# Patient Record
Sex: Female | Born: 1970 | Hispanic: Yes | Marital: Single | Smoking: Never smoker
Health system: Southern US, Community
[De-identification: ages and names within clinical notes are randomized; demographics above are authoritative.]

## PROBLEM LIST (undated history)

## (undated) DIAGNOSIS — N809 Endometriosis, unspecified: Secondary | ICD-10-CM

## (undated) DIAGNOSIS — J329 Chronic sinusitis, unspecified: Secondary | ICD-10-CM

## (undated) HISTORY — PX: ABDOMINAL HYSTERECTOMY: SHX81

## (undated) HISTORY — DX: Chronic sinusitis, unspecified: J32.9

## (undated) HISTORY — PX: TUBAL LIGATION: SHX77

## (undated) HISTORY — DX: Endometriosis, unspecified: N80.9

---

## 2002-06-28 ENCOUNTER — Encounter: Admission: RE | Admit: 2002-06-28 | Discharge: 2002-06-28 | Payer: Self-pay | Admitting: Family Medicine

## 2002-06-28 ENCOUNTER — Encounter: Payer: Self-pay | Admitting: Family Medicine

## 2002-12-22 ENCOUNTER — Encounter: Admission: RE | Admit: 2002-12-22 | Discharge: 2002-12-22 | Payer: Self-pay | Admitting: Family Medicine

## 2002-12-22 ENCOUNTER — Encounter: Payer: Self-pay | Admitting: Family Medicine

## 2003-05-11 ENCOUNTER — Other Ambulatory Visit: Admission: RE | Admit: 2003-05-11 | Discharge: 2003-05-11 | Payer: Self-pay | Admitting: Gynecology

## 2003-10-23 ENCOUNTER — Inpatient Hospital Stay (HOSPITAL_COMMUNITY): Admission: AD | Admit: 2003-10-23 | Discharge: 2003-10-26 | Payer: Self-pay | Admitting: *Deleted

## 2004-05-07 ENCOUNTER — Other Ambulatory Visit: Admission: RE | Admit: 2004-05-07 | Discharge: 2004-05-07 | Payer: Self-pay | Admitting: Gynecology

## 2004-11-13 ENCOUNTER — Ambulatory Visit (HOSPITAL_BASED_OUTPATIENT_CLINIC_OR_DEPARTMENT_OTHER): Admission: RE | Admit: 2004-11-13 | Discharge: 2004-11-13 | Payer: Self-pay | Admitting: Gynecology

## 2005-05-20 ENCOUNTER — Other Ambulatory Visit: Admission: RE | Admit: 2005-05-20 | Discharge: 2005-05-20 | Payer: Self-pay | Admitting: Gynecology

## 2006-03-10 ENCOUNTER — Ambulatory Visit (HOSPITAL_COMMUNITY): Admission: RE | Admit: 2006-03-10 | Discharge: 2006-03-10 | Payer: Self-pay | Admitting: Gynecology

## 2006-03-19 ENCOUNTER — Encounter: Admission: RE | Admit: 2006-03-19 | Discharge: 2006-03-19 | Payer: Self-pay | Admitting: Gastroenterology

## 2013-04-04 ENCOUNTER — Encounter: Payer: Self-pay | Admitting: Neurology

## 2013-04-05 ENCOUNTER — Ambulatory Visit: Payer: PRIVATE HEALTH INSURANCE | Admitting: Neurology

## 2013-06-07 ENCOUNTER — Telehealth: Payer: Self-pay | Admitting: Neurology

## 2013-06-07 NOTE — Telephone Encounter (Signed)
Calling patient to r/s missed appointment, informed by gentleman that answered the phone wrong number [phone]).

## 2014-09-30 ENCOUNTER — Encounter (HOSPITAL_BASED_OUTPATIENT_CLINIC_OR_DEPARTMENT_OTHER): Payer: Self-pay | Admitting: *Deleted

## 2014-09-30 ENCOUNTER — Emergency Department (HOSPITAL_BASED_OUTPATIENT_CLINIC_OR_DEPARTMENT_OTHER)
Admission: EM | Admit: 2014-09-30 | Discharge: 2014-09-30 | Disposition: A | Discharge status: Home / Self Care | Payer: PRIVATE HEALTH INSURANCE | Attending: Emergency Medicine | Admitting: Emergency Medicine

## 2014-09-30 DIAGNOSIS — Y9289 Other specified places as the place of occurrence of the external cause: Secondary | ICD-10-CM | POA: Diagnosis not present

## 2014-09-30 DIAGNOSIS — W540XXA Bitten by dog, initial encounter: Secondary | ICD-10-CM | POA: Insufficient documentation

## 2014-09-30 DIAGNOSIS — Y9389 Activity, other specified: Secondary | ICD-10-CM | POA: Insufficient documentation

## 2014-09-30 DIAGNOSIS — S6992XA Unspecified injury of left wrist, hand and finger(s), initial encounter: Secondary | ICD-10-CM | POA: Diagnosis present

## 2014-09-30 DIAGNOSIS — S60512A Abrasion of left hand, initial encounter: Secondary | ICD-10-CM | POA: Diagnosis not present

## 2014-09-30 DIAGNOSIS — Y998 Other external cause status: Secondary | ICD-10-CM | POA: Diagnosis not present

## 2014-09-30 DIAGNOSIS — Z8742 Personal history of other diseases of the female genital tract: Secondary | ICD-10-CM | POA: Insufficient documentation

## 2014-09-30 DIAGNOSIS — Z23 Encounter for immunization: Secondary | ICD-10-CM | POA: Diagnosis not present

## 2014-09-30 DIAGNOSIS — Z8709 Personal history of other diseases of the respiratory system: Secondary | ICD-10-CM | POA: Insufficient documentation

## 2014-09-30 DIAGNOSIS — S61452A Open bite of left hand, initial encounter: Secondary | ICD-10-CM

## 2014-09-30 MED ORDER — TETANUS-DIPHTH-ACELL PERTUSSIS 5-2.5-18.5 LF-MCG/0.5 IM SUSP
0.5000 mL | Freq: Once | INTRAMUSCULAR | Status: AC
  Administered 2014-09-30: 0.5 mL via INTRAMUSCULAR
  Filled 2014-09-30: qty 0.5

## 2014-09-30 MED ORDER — AMOXICILLIN-POT CLAVULANATE 875-125 MG PO TABS: 1.0000 | ORAL_TABLET | Freq: Two times a day (BID) | ORAL | Status: DC

## 2014-09-30 NOTE — ED Notes (Signed)
Bitten by a dog in her left hand and arm yesterday.

## 2014-09-30 NOTE — Discharge Instructions (Signed)

## 2014-09-30 NOTE — ED Provider Notes (Signed)
CSN: [id number]     Arrival date & time 09/30/14  1547 History   First MD Initiated Contact with Patient 09/30/14 1604     Chief Complaint  Patient presents with  . Animal Bite     (Consider location/radiation/quality/duration/timing/severity/associated sxs/prior Treatment) Patient is a 44 y.o. female presenting with animal bite. The history is provided by the patient. No language interpreter was used.  Animal Bite Contact animal:  Dog Location:  Hand Hand injury location:  L hand Time since incident:  1 day Pain details:    Quality:  Aching   Severity:  Moderate   Timing:  Constant   Progression:  Worsening Provoked: unprovoked   Notifications:  None Animal's rabies vaccination status:  Up to date Animal in possession: yes   Tetanus status:  Out of date Relieved by:  Nothing Worsened by:  Nothing tried Ineffective treatments:  None tried   Past Medical History  Diagnosis Date  . Chronic sinusitis   . Endometriosis    Past Surgical History  Procedure Laterality Date  . Tubal ligation    . Abdominal hysterectomy     History reviewed. No pertinent family history. History  Substance Use Topics  . Smoking status: Never Smoker   . Smokeless tobacco: Not on file  . Alcohol Use: No   OB History    No data available     Review of Systems  All other systems reviewed and are negative.     Allergies  Review of patient's allergies indicates no known allergies.  Home Medications   Prior to Admission medications   Medication Sig Start Date End Date Taking? Authorizing Provider  amoxicillin-clavulanate (AUGMENTIN) 875-125 MG per tablet Take 1 tablet by mouth 2 (two) times daily. 09/30/14   Elson AreasLeslie K Sofia, PA-C   BP 127/85 mmHg  Pulse 80  Temp(Src) 98.5 F (36.9 C) (Oral)  Resp 18  Ht 5\' 3"  (1.6 m)  Wt 215 lb (97.523 kg)  BMI 38.09 kg/m2  SpO2 99% Physical Exam  Constitutional: She is oriented to person, place, and time. She appears well-developed and  well-nourished.  Musculoskeletal: She exhibits tenderness.  Small abrasions left hand, no gapping  Neurological: She is alert and oriented to person, place, and time.  Skin: Skin is warm.  Psychiatric: She has a normal mood and affect.  Vitals reviewed.   ED Course  Procedures (including critical care time) Labs Review Labs Reviewed - No data to display  Imaging Review No results found.   EKG Interpretation None      MDM   Final diagnoses:  Animal bite of left hand, initial encounter    Tetanus Augmentin    Elson AreasLeslie K Sofia, PA-C 09/30/14 1622  Arby BarretteMarcy Pfeiffer, MD 09/30/14 [phone]

## 2015-10-08 ENCOUNTER — Emergency Department (HOSPITAL_BASED_OUTPATIENT_CLINIC_OR_DEPARTMENT_OTHER)
Admission: EM | Admit: 2015-10-08 | Discharge: 2015-10-08 | Disposition: A | Discharge status: Home / Self Care | Payer: PRIVATE HEALTH INSURANCE | Attending: Emergency Medicine | Admitting: Emergency Medicine

## 2015-10-08 ENCOUNTER — Encounter (HOSPITAL_BASED_OUTPATIENT_CLINIC_OR_DEPARTMENT_OTHER): Payer: Self-pay

## 2015-10-08 ENCOUNTER — Emergency Department (HOSPITAL_BASED_OUTPATIENT_CLINIC_OR_DEPARTMENT_OTHER): Payer: PRIVATE HEALTH INSURANCE

## 2015-10-08 DIAGNOSIS — R300 Dysuria: Secondary | ICD-10-CM | POA: Diagnosis not present

## 2015-10-08 DIAGNOSIS — R0789 Other chest pain: Secondary | ICD-10-CM | POA: Diagnosis not present

## 2015-10-08 DIAGNOSIS — R079 Chest pain, unspecified: Secondary | ICD-10-CM

## 2015-10-08 DIAGNOSIS — R002 Palpitations: Secondary | ICD-10-CM | POA: Insufficient documentation

## 2015-10-08 DIAGNOSIS — R0602 Shortness of breath: Secondary | ICD-10-CM | POA: Diagnosis not present

## 2015-10-08 DIAGNOSIS — R109 Unspecified abdominal pain: Secondary | ICD-10-CM | POA: Diagnosis present

## 2015-10-08 DIAGNOSIS — R350 Frequency of micturition: Secondary | ICD-10-CM | POA: Diagnosis not present

## 2015-10-08 LAB — URINALYSIS, ROUTINE W REFLEX MICROSCOPIC
BILIRUBIN URINE: NEGATIVE
Glucose, UA: NEGATIVE mg/dL
HGB URINE DIPSTICK: NEGATIVE
Ketones, ur: NEGATIVE mg/dL
Leukocytes, UA: NEGATIVE
Nitrite: NEGATIVE
PH: 7 (ref 5.0–8.0)
Protein, ur: NEGATIVE mg/dL
SPECIFIC GRAVITY, URINE: 1.024 (ref 1.005–1.030)

## 2015-10-08 LAB — BASIC METABOLIC PANEL
Anion gap: 8 (ref 5–15)
BUN: 13 mg/dL (ref 6–20)
CHLORIDE: 106 mmol/L (ref 101–111)
CO2: 23 mmol/L (ref 22–32)
CREATININE: 0.75 mg/dL (ref 0.44–1.00)
Calcium: 8.6 mg/dL — ABNORMAL LOW (ref 8.9–10.3)
GFR calc non Af Amer: >60 mL/min (ref >60)
GLUCOSE: 95 mg/dL (ref 65–99)
Potassium: 3.7 mmol/L (ref 3.5–5.1)
SODIUM: 137 mmol/L (ref 135–145)

## 2015-10-08 LAB — CBC WITH DIFFERENTIAL/PLATELET
BASOS ABS: 0 10*3/uL (ref 0.0–0.1)
BASOS PCT: 0 %
EOS ABS: 0.2 10*3/uL (ref 0.0–0.7)
Eosinophils Relative: 3 %
HCT: 37.2 % (ref 36.0–46.0)
HEMOGLOBIN: 13 g/dL (ref 12.0–15.0)
Lymphocytes Relative: 41 %
Lymphs Abs: 2.8 10*3/uL (ref 0.7–4.0)
MCH: 29.8 pg (ref 26.0–34.0)
MCHC: 34.9 g/dL (ref 30.0–36.0)
MCV: 85.3 fL (ref 78.0–100.0)
Monocytes Absolute: 0.6 10*3/uL (ref 0.1–1.0)
Monocytes Relative: 8 %
NEUTROS PCT: 48 %
Neutro Abs: 3.1 10*3/uL (ref 1.7–7.7)
Platelets: 294 10*3/uL (ref 150–400)
RBC: 4.36 MIL/uL (ref 3.87–5.11)
RDW: 12.9 % (ref 11.5–15.5)
WBC: 6.7 10*3/uL (ref 4.0–10.5)

## 2015-10-08 LAB — TROPONIN I

## 2015-10-08 MED ORDER — GI COCKTAIL ~~LOC~~
30.0000 mL | Freq: Once | ORAL | Status: AC
  Administered 2015-10-08: 30 mL via ORAL
  Filled 2015-10-08: qty 30

## 2015-10-08 NOTE — ED Notes (Signed)
C/o PC x 1 week-worse yesterday-NAD-steady gait

## 2015-10-08 NOTE — ED Provider Notes (Signed)
CSN: [id number]     Arrival date & time 10/08/15  1538 History   First MD Initiated Contact with Patient 10/08/15 1547     Chief Complaint  Patient presents with  . Chest Pain  . Flank Pain     (Consider location/radiation/quality/duration/timing/severity/associated sxs/prior Treatment) Patient is a 45 y.o. female presenting with chest pain and flank pain. The history is provided by the patient and a friend.  Chest Pain Pain location:  L lateral chest Pain quality: pressure   Pain radiates to:  Does not radiate Pain radiates to the back: no   Pain severity:  Moderate Onset quality:  Gradual Duration:  1 week Timing:  Intermittent Progression:  Worsening Chronicity:  New Relieved by:  Nothing Worsened by:  Exertion and movement Ineffective treatments:  None tried Associated symptoms: diaphoresis, palpitations and shortness of breath   Associated symptoms: no dizziness, no fever, no headache, no nausea and not vomiting   Flank Pain Associated symptoms include chest pain and shortness of breath. Pertinent negatives include no headaches.    45 yo F With a chief complaint of chest pain. This pain is left-sided and feels like a pressure. Patient does not feel like anything makes it better or worse. She sometimes wakes up with this pain and then it resolves over the course of a couple minutes with moving her left arm. Denies any injury. Denies fevers or chills. Denies nausea or vomiting.  On review systems however when asked if she had exertional symptoms patient states that first thing in the morning when she goes up a flight of stairs to get out of her apartment she becomes very short of breath and is sweaty and lightheaded with associated chest pressure. This usually resolves with little bit arrest and that is not persistent through the rest of the day.  Past Medical History  Diagnosis Date  . Chronic sinusitis   . Endometriosis    Past Surgical History  Procedure Laterality  Date  . Tubal ligation    . Abdominal hysterectomy     No family history on file. Social History  Substance Use Topics  . Smoking status: Never Smoker   . Smokeless tobacco: None  . Alcohol Use: No   OB History    No data available     Review of Systems  Constitutional: Positive for diaphoresis. Negative for fever and chills.  HENT: Negative for congestion and rhinorrhea.   Eyes: Negative for redness and visual disturbance.  Respiratory: Positive for shortness of breath. Negative for wheezing.   Cardiovascular: Positive for chest pain and palpitations.  Gastrointestinal: Negative for nausea and vomiting.  Genitourinary: Positive for dysuria, frequency and flank pain. Negative for urgency, vaginal bleeding, vaginal discharge and vaginal pain.  Musculoskeletal: Negative for myalgias and arthralgias.  Skin: Negative for pallor and wound.  Neurological: Negative for dizziness and headaches.      Allergies  Review of patient's allergies indicates no known allergies.  Home Medications   Prior to Admission medications   Not on File   BP 120/89 mmHg  Pulse 58  Temp(Src) 98.2 F (36.8 C) (Oral)  Resp 22  Ht 5\' 2"  (1.575 m)  Wt 210 lb (95.255 kg)  BMI 38.40 kg/m2  SpO2 98% Physical Exam  Constitutional: She is oriented to person, place, and time. She appears well-developed and well-nourished. No distress.  HENT:  Head: Normocephalic and atraumatic.  Eyes: EOM are normal. Pupils are equal, round, and reactive to light.  Neck: Normal range of  motion. Neck supple.  Cardiovascular: Normal rate and regular rhythm.  Exam reveals no gallop and no friction rub.   No murmur heard. Pulmonary/Chest: Effort normal. She has no wheezes. She has no rales.  Abdominal: Soft. She exhibits no distension. There is no tenderness. There is no rebound and no guarding.  Musculoskeletal: She exhibits no edema or tenderness.  Neurological: She is alert and oriented to person, place, and time.   Skin: Skin is warm and dry. She is not diaphoretic.  Psychiatric: She has a normal mood and affect. Her behavior is normal.  Nursing note and vitals reviewed.   ED Course  Procedures (including critical care time) Labs Review Labs Reviewed  BASIC METABOLIC PANEL - Abnormal; Notable for the following:    Calcium 8.6 (*)    All other components within normal limits  URINALYSIS, ROUTINE W REFLEX MICROSCOPIC (NOT AT [facility])  TROPONIN I  CBC WITH DIFFERENTIAL/PLATELET  TROPONIN I    Imaging Review Dg Chest 2 View  10/08/2015  CLINICAL DATA:  Left chest pain EXAM: CHEST  2 VIEW COMPARISON:  April 09, 2015 FINDINGS: The heart size and mediastinal contours are within normal limits. There is no focal infiltrate, pulmonary edema, or pleural effusion. Minimal degenerative joint changes of the spine are noted. IMPRESSION: No active cardiopulmonary disease. Electronically Signed   By: Sherian Rein M.D.   On: 10/08/2015 16:15   I have personally reviewed and evaluated these images and lab results as part of my medical decision-making.   EKG Interpretation   Date/Time:  Tuesday October 08 2015 15:51:15 EDT Ventricular Rate:  55 PR Interval:    QRS Duration: 93 QT Interval:  456 QTC Calculation: 437 R Axis:   59 Text Interpretation:  Sinus rhythm Low voltage, precordial leads No old  tracing to compare Confirmed by Diala Waxman MD, DANIEL [phone]) on 10/08/2015  3:57:37 PM      MDM   Final diagnoses:  Chest pain, unspecified chest pain type    45 yo F with a chief complaint of chest pain. Patient has no risk factors. Her story is moderately concerning. I will obtain a delta troponin. PERC negative.   Delta negative, d/c home.   8:04 PM:  I have discussed the diagnosis/risks/treatment options with the patient and family and believe the pt to be eligible for discharge home to follow-up with PCP. We also discussed returning to the ED immediately if new or worsening sx occur. We discussed the sx  which are most concerning (e.g., sudden worsening pain, fever, inability to tolerate by mouth) that necessitate immediate return. Medications administered to the patient during their visit and any new prescriptions provided to the patient are listed below.  Medications given during this visit Medications  gi cocktail (Maalox,Lidocaine,Donnatal) (30 mLs Oral Given 10/08/15 1620)    There are no discharge medications for this patient.   The patient appears reasonably screen and/or stabilized for discharge and I doubt any other medical condition or other [facility] requiring further screening, evaluation, or treatment in the ED at this time prior to discharge.    Melene Plan, DO 10/08/15 2004

## 2015-10-08 NOTE — Discharge Instructions (Signed)
Dolor de pecho inespecfico  (Nonspecific Chest Pain) El dolor de pecho puede deberse a muchas enfermedades diferentes. Siempre existe una posibilidad de que el dolor est relacionado con algo grave, como un infarto de miocardio o un cogulo sanguneo en los pulmones. Hay muchas enfermedades que no son potencialmente mortales que pueden causar dolor de pecho. Si tiene dolor de pecho, es muy importante que se controle con el mdico. CAUSAS  Las causas del dolor de pecho pueden ser las siguientes:  Acidez estomacal.  Neumona o bronquitis.  Ansiedad o estrs.  Inflamacin de la zona que rodea al corazn (pericarditis) o a los pulmones (pleuritis o pleuresa).  Un cogulo sanguneo en el pulmn.  Colapso de un pulmn (neumotrax), que puede aparecer de manera repentina por s solo (neumotrax espontneo) o debido a un traumatismo en el trax.  Culebrilla (virus de la varicela zster).  Infarto de miocardio.  Dao de los huesos, los msculos y los cartlagos que conforman la pared torcica. Esto puede incluir lo siguiente:  Hematomas seos debido a lesiones.  Distensiones musculares o de los cartlagos por tos frecuente o repetida, o por exceso de trabajo.  Fractura de una o ms costillas.  Dolor de cartlago debido a inflamacin (costocondritis). FACTORES DE RIESGO  Los factores de riesgo de tener dolor de pecho pueden incluir lo siguiente:  Actividades que incrementan el riesgo de sufrir traumatismos o lesiones en el trax.  Infecciones o enfermedades respiratorias que causan tos frecuente.  Enfermedades o excesos en las comidas que pueden causar acidez.  Enfermedades cardacas o antecedentes familiares de enfermedades cardacas.  Enfermedades o comportamientos de salud que aumentan el riesgo de tener un cogulo sanguneo.  Haber tenido varicela (varicela zster). SIGNOS Y SNTOMAS El dolor de pecho puede provocar las siguientes sensaciones:  Ardor u hormigueo en la  superficie o en lo profundo del pecho.  Dolor opresivo, continuo o constrictivo.  Dolor vago o intenso que empeora al moverse, toser o inhalar profundamente.  Dolor que tambin se siente en la espalda, el cuello, el hombro o el brazo, o dolor que se irradia a cualquiera de estas zonas. El dolor de pecho puede aparecer y desaparecer, o bien puede ser constante. DIAGNSTICO Quizs se necesiten anlisis de laboratorio u otros estudios para encontrar la causa del dolor. El mdico puede indicarle que se haga una prueba llamada EGC (electrocadiograma) ambulatorio. El electrocardiograma registra los patrones de los latidos cardacos en el momento en que se realiza el estudio. Tambin pueden hacerle otros estudios, por ejemplo:  Ecocardiograma transtorcico (ETT). Durante el ecocardiograma, se usan ondas sonoras para crear una imagen de todas las estructuras cardacas y evaluar cmo circula la sangre por el corazn.  Ecocardiograma transesofgico (ETE).Este es un estudio de diagnstico por imgenes ms avanzado que el obtiene imgenes del interior del cuerpo. Le permite al mdico ver el corazn con mayor detalle.  Monitoreo cardaco. Permite que el mdico controle la frecuencia y el ritmo cardaco en tiempo real.  Monitor Holter. Es un dispositivo porttil que registra los latidos del corazn y puede ayudar a diagnosticar las arritmias cardacas. Le permite al mdico registrar la actividad cardaca durante varios das, si es necesario.  Pruebas de esfuerzo. Estas pueden realizarse durante el ejercicio o mediante la administracin de un medicamento que acelera los latidos del corazn.  Anlisis de sangre.  Diagnstico por imgenes. TRATAMIENTO  El tratamiento depende de la causa del dolor de pecho. El tratamiento puede incluir lo siguiente:  Medicamentos. Estos pueden incluir lo siguiente:    Inhibidores de la acidez estomacal.  Antiinflamatorios.  Analgsicos para las enfermedades  inflamatorias.  Antibiticos, si hay una infeccin.  Medicamentos para disolver los cogulos sanguneos.  Medicamentos para tratar la enfermedad arterial coronaria.  Tratamiento complementario para las enfermedades que no requieren la toma de medicamentos. Esto puede incluir lo siguiente:  Descansar.  Aplicar compresas fras o calientes en las zonas lesionadas.  Limitar las actividades hasta que disminuya el dolor. INSTRUCCIONES PARA EL CUIDADO EN EL HOGAR  Si le recetaron antibiticos, asegrese de terminarlos, incluso si comienza a sentirse mejor.  Evite las actividades que le causen dolor de pecho.  No consuma ningn producto que contenga tabaco, lo que incluye cigarrillos, tabaco de mascar o cigarrillos electrnicos. Si necesita ayuda para dejar de fumar, consulte al mdico.  No beba alcohol.  Tome los medicamentos solamente como se lo haya indicado el mdico.  Concurra a todas las visitas de control como se lo haya indicado el mdico. Esto es importante. Esto incluye otros estudios si el dolor de pecho no desaparece.  Si la acidez es la causa del dolor de pecho, tal vez le aconsejen que mantenga la cabeza levantada (elevada) mientras duerme. Esto reduce la probabilidad de que el cido retroceda del estmago al esfago.  Haga cambios en su estilo de vida como se lo haya indicado el mdico. Estos pueden incluir lo siguiente:  Practicar actividad fsica con regularidad. Pida al mdico que le sugiera algunas actividades que sean seguras para usted.  Consumir una dieta cardiosaludable. Un nutricionista matriculado puede ayudarlo a hacer elecciones saludables.  Mantener un peso saludable.  Controlar la diabetes, si es necesario.  Reducir las situaciones de estrs. SOLICITE ATENCIN MDICA SI:  El dolor de pecho no desaparece despus del tratamiento.  Tiene una erupcin cutnea con ampollas en el pecho.  Tiene fiebre. SOLICITE ATENCIN MDICA DE INMEDIATO SI:   El  dolor en el pecho es ms intenso.  La tos empeora, o expectora sangre.  Siente un dolor abdominal intenso.  Siente debilidad intensa.  Se desmaya.  Tiene escalofros.  Tiene una molestia repentina e inexplicable en el pecho.  Tiene molestias repentinas e inexplicables en los brazos, la espalda, el cuello o la mandbula.  Le falta el aire en cualquier momento.  Comienza a sudar de manera repentina o la piel se le humedece.  Siente nuseas o vomita.  Se siente repentinamente mareado o se desmaya.  Siente que el corazn comienza a latir rpidamente o que se saltea latidos. Estos sntomas pueden representar un problema grave que constituye una emergencia. No espere hasta que los sntomas desaparezcan. Solicite atencin mdica de inmediato. Comunquese con el servicio de emergencias de su localidad (911 en los [place]). No conduzca por sus propios medios hasta el hospital.   Esta informacin no tiene como fin reemplazar el consejo del mdico. Asegrese de hacerle al mdico cualquier pregunta que tenga.   Document Released: 03/16/2005 Document Revised: 04/06/2014 Elsevier Interactive Patient Education 2016 [employer]  

## 2015-10-08 NOTE — ED Notes (Signed)
Pt also c/o left flank pain and dysuria-states she thinks she has a UTI
